# Patient Record
Sex: Male | Born: 2000 | Race: White | Hispanic: No | Marital: Single | Smoking: Current every day smoker
Health system: Southern US, Community
[De-identification: ages and names within clinical notes are randomized; demographics above are authoritative.]

## PROBLEM LIST (undated history)

## (undated) DIAGNOSIS — F319 Bipolar disorder, unspecified: Secondary | ICD-10-CM

## (undated) DIAGNOSIS — F39 Unspecified mood [affective] disorder: Secondary | ICD-10-CM

## (undated) DIAGNOSIS — F32A Depression, unspecified: Secondary | ICD-10-CM

## (undated) DIAGNOSIS — F419 Anxiety disorder, unspecified: Secondary | ICD-10-CM

## (undated) HISTORY — DX: Depression, unspecified: F32.A

## (undated) HISTORY — DX: Bipolar disorder, unspecified: F31.9

## (undated) HISTORY — DX: Anxiety disorder, unspecified: F41.9

---
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
## 2020-02-23 ENCOUNTER — Telehealth: Payer: Self-pay | Admitting: General Practice

## 2020-02-23 NOTE — Telephone Encounter (Signed)
Advise patient: Jacob Matthews to establish with me. He has a history of bipolar, needs to see psychiatry for refill on his medications.

## 2020-02-23 NOTE — Telephone Encounter (Signed)
Patient is requesting to establish care with you b/c his grandfather sees you (Jacob Matthews)   Please advise

## 2020-02-24 NOTE — Telephone Encounter (Signed)
Spoke to patient appt was scheduled. Patient was told to come by prior to his visit to sign ROI, also Dr. Drue Novel note in regards to psychiatry

## 2020-02-24 NOTE — Telephone Encounter (Signed)
Okay to schedule NP appt at his convenience. Please have him come by prior to his visit to sign an ROI to get his previous records.

## 2020-04-06 DIAGNOSIS — F32A Depression, unspecified: Secondary | ICD-10-CM | POA: Insufficient documentation

## 2020-04-06 DIAGNOSIS — F319 Bipolar disorder, unspecified: Secondary | ICD-10-CM | POA: Insufficient documentation

## 2020-04-06 DIAGNOSIS — F419 Anxiety disorder, unspecified: Secondary | ICD-10-CM | POA: Insufficient documentation

## 2020-04-07 ENCOUNTER — Other Ambulatory Visit: Payer: Self-pay

## 2020-04-07 ENCOUNTER — Ambulatory Visit (INDEPENDENT_AMBULATORY_CARE_PROVIDER_SITE_OTHER): Payer: Managed Care, Other (non HMO) | Admitting: Internal Medicine

## 2020-04-07 ENCOUNTER — Encounter: Payer: Self-pay | Admitting: Internal Medicine

## 2020-04-07 ENCOUNTER — Ambulatory Visit (HOSPITAL_BASED_OUTPATIENT_CLINIC_OR_DEPARTMENT_OTHER)
Admission: RE | Admit: 2020-04-07 | Discharge: 2020-04-07 | Disposition: A | Discharge status: Home / Self Care | Payer: Managed Care, Other (non HMO) | Source: Ambulatory Visit | Attending: Internal Medicine | Admitting: Internal Medicine

## 2020-04-07 VITALS — BP 126/73 | HR 84 | Temp 98.3°F | Ht 76.0 in | Wt 174.0 lb

## 2020-04-07 DIAGNOSIS — R079 Chest pain, unspecified: Secondary | ICD-10-CM

## 2020-04-07 NOTE — Progress Notes (Signed)
Subjective:    Patient ID: Jacob Matthews, male    DOB: [birth year], 19 y.o.   MRN: [id number]  DOS:  04/07/2020 Type of visit - description: New patient, referred by his grandfather who is my patient  The patient is concerned because he has been awakened by chest pain 4 times in the last 4 months. The patient is a sleeping, awakened by the pain, it is a stabbing feeling in the center of the chest, last about 30 minutes. No radiation, no associated nausea or vomiting. Mild palpitations and diaphoresis?. Episodes are random and he cannot point to triggers.  Review of Systems He is active at work and has no exertional symptoms No shortness of breath, lower extremity edema or palpitations. No heartburn, dysphagia or odynophagia. Occasional emotional stress but nothing out of the ordinary.   Past Medical History:  Diagnosis Date  . Anxiety   . Bipolar disorder (HCC)   . Depression     History reviewed. No pertinent surgical history.  Allergies as of 04/07/2020   No Known Allergies     Medication List       Accurate as of April 07, 2020 11:59 PM. If you have any questions, ask your nurse or doctor.        STOP taking these medications   divalproex 500 MG DR tablet Commonly known as: DEPAKOTE Stopped by: Willow Ora, MD   lamoTRIgine 100 MG tablet Commonly known as: LAMICTAL Stopped by: Willow Ora, MD   OLANZapine 10 MG tablet Commonly known as: ZYPREXA Stopped by: Willow Ora, MD     TAKE these medications   ibuprofen 200 MG tablet Commonly known as: ADVIL Take 600 mg by mouth every 6 (six) hours as needed (pain).          Objective:   Physical Exam BP 126/73 (BP Location: Right Arm, Patient Position: Sitting, Cuff Size: Large)   Pulse 84   Temp 98.3 F (36.8 C) (Oral)   Ht 6\' 4"  (1.93 m)   Wt 174 lb (78.9 kg)   SpO2 97%   BMI 21.18 kg/m     General:   Well developed, NAD, BMI noted.  HEENT:  Normocephalic . Face symmetric, atraumatic Neck: No  thyromegaly Lungs:  CTA B Normal respiratory effort, no intercostal retractions, no accessory muscle use. Heart: RRR,  no murmur.  Abdomen:  Not distended, soft, non-tender. No rebound or rigidity.   Skin: Not pale. Not jaundice Lower extremities: no pretibial edema bilaterally  Neurologic:  alert & oriented X3.  Speech normal, gait appropriate for age and unassisted Psych--  Cognition and judgment appear intact.  Cooperative with normal attention span and concentration.  Behavior appropriate. No anxious or depressed appearing.   Assessment    Assessment (new patient 04/2020 referred by 05/2020, his grandfather) H/o anxiety-depression   Plan: Chest pain: Random episodes, approximately once a month, last episode was 4 weeks ago, they are described in the HPI, not exertional. Etiology not completely clear. EKG: NSR, no evidence of pericarditis. Plan: Check a chest x-ray, CMP, CBC, sed rate.  If symptoms become more frequent or intense may need further evaluation >> ECHO ?, trial with PPIs?. Preventive care: Declined a flu shot, encouraged to pursue a Covid booster RTC 6 to 8 weeks CPX.    This visit occurred during the SARS-CoV-2 public health emergency.  Safety protocols were in place, including screening questions prior to the visit, additional usage of staff PPE, and extensive cleaning of exam room  while observing appropriate contact time as indicated for disinfecting solutions.

## 2020-04-07 NOTE — Patient Instructions (Signed)
If you have more frequent or intense pains, please call immediately  GO TO THE LAB : Get the blood work     GO TO THE FRONT DESK, PLEASE SCHEDULE YOUR APPOINTMENTS Come back for a physical exam in 6 to 8 weeks  Check the  blood pressure 2 or 3 times a month week monthly weekly daily BP GOAL is between 110/65 and  135/85. If it is consistently higher or lower, let me know   STOP BY THE FIRST FLOOR:  get the XR

## 2020-04-08 LAB — CBC WITH DIFFERENTIAL/PLATELET
Basophils Absolute: 0 10*3/uL (ref 0.0–0.1)
Basophils Relative: 0.4 % (ref 0.0–3.0)
Eosinophils Absolute: 0.1 10*3/uL (ref 0.0–0.7)
Eosinophils Relative: 1.5 % (ref 0.0–5.0)
HCT: 43.9 % (ref 39.0–52.0)
Hemoglobin: 15 g/dL (ref 13.0–17.0)
Lymphocytes Relative: 32.1 % (ref 12.0–46.0)
Lymphs Abs: 1.6 10*3/uL (ref 0.7–4.0)
MCHC: 34.2 g/dL (ref 30.0–36.0)
MCV: 88.1 fl (ref 78.0–100.0)
Monocytes Absolute: 0.4 10*3/uL (ref 0.1–1.0)
Monocytes Relative: 7.8 % (ref 3.0–12.0)
Neutro Abs: 2.9 10*3/uL (ref 1.4–7.7)
Neutrophils Relative %: 58.2 % (ref 43.0–77.0)
Platelets: 170 10*3/uL (ref 150.0–400.0)
RBC: 4.99 Mil/uL (ref 4.22–5.81)
RDW: 12.5 % (ref 11.5–14.6)
WBC: 4.9 10*3/uL (ref 4.5–10.5)

## 2020-04-08 LAB — COMPREHENSIVE METABOLIC PANEL
ALT: 14 U/L (ref 0–53)
AST: 16 U/L (ref 0–37)
Albumin: 4.7 g/dL (ref 3.5–5.2)
Alkaline Phosphatase: 61 U/L (ref 39–117)
BUN: 12 mg/dL (ref 6–23)
CO2: 32 mEq/L (ref 19–32)
Calcium: 9.5 mg/dL (ref 8.4–10.5)
Chloride: 103 mEq/L (ref 96–112)
Creatinine, Ser: 0.95 mg/dL (ref 0.40–1.50)
GFR: 115.45 mL/min (ref >60.00)
Glucose, Bld: 66 mg/dL — ABNORMAL LOW (ref 70–99)
Potassium: 4.2 mEq/L (ref 3.5–5.1)
Sodium: 141 mEq/L (ref 135–145)
Total Bilirubin: 0.9 mg/dL (ref 0.2–1.2)
Total Protein: 7.4 g/dL (ref 6.0–8.3)

## 2020-04-08 LAB — SEDIMENTATION RATE: Sed Rate: 3 mm/hr (ref 0–15)

## 2020-04-10 ENCOUNTER — Encounter: Payer: Self-pay | Admitting: Internal Medicine

## 2020-06-16 ENCOUNTER — Encounter: Payer: Managed Care, Other (non HMO) | Admitting: Internal Medicine

## 2020-06-16 ENCOUNTER — Encounter: Payer: Self-pay | Admitting: Internal Medicine

## 2020-07-20 ENCOUNTER — Encounter: Payer: Self-pay | Admitting: Internal Medicine

## 2020-07-20 ENCOUNTER — Other Ambulatory Visit: Payer: Self-pay

## 2020-07-20 ENCOUNTER — Ambulatory Visit (HOSPITAL_BASED_OUTPATIENT_CLINIC_OR_DEPARTMENT_OTHER)
Admission: RE | Admit: 2020-07-20 | Discharge: 2020-07-20 | Disposition: A | Discharge status: Home / Self Care | Payer: Managed Care, Other (non HMO) | Source: Ambulatory Visit | Attending: Internal Medicine | Admitting: Internal Medicine

## 2020-07-20 ENCOUNTER — Ambulatory Visit (INDEPENDENT_AMBULATORY_CARE_PROVIDER_SITE_OTHER): Payer: Managed Care, Other (non HMO) | Admitting: Internal Medicine

## 2020-07-20 VITALS — BP 131/79 | HR 76 | Temp 98.0°F | Ht 76.0 in | Wt 168.6 lb

## 2020-07-20 DIAGNOSIS — R079 Chest pain, unspecified: Secondary | ICD-10-CM | POA: Insufficient documentation

## 2020-07-20 DIAGNOSIS — J939 Pneumothorax, unspecified: Secondary | ICD-10-CM

## 2020-07-20 DIAGNOSIS — Z09 Encounter for follow-up examination after completed treatment for conditions other than malignant neoplasm: Secondary | ICD-10-CM | POA: Insufficient documentation

## 2020-07-20 LAB — SEDIMENTATION RATE: Sed Rate: 1 mm/hr (ref 0–15)

## 2020-07-20 NOTE — Progress Notes (Signed)
Subjective:    Patient ID: Jacob Matthews, male    DOB: [birth year], 19 y.o.   MRN: [id number]  DOS:  07/20/2020 Type of visit - description: acute  Symptoms a started yesterday, at 2 p.m. Report chest pain with a very deep breath that radiates to his back. The pain is located at the left sternal border, no associated with other symptoms. I ask if the pain is worse when he leans forward and he said yes.  She was seen with chest pain few months ago but he states "this is different".   Review of Systems Denies fever chills. No recent URI No shortness of breath, lower extremity edema, palpitation.  No rash No heartburn, dysphagia or odynophagia No cough or sputum production  Past Medical History:  Diagnosis Date  . Anxiety   . Bipolar disorder (HCC)   . Depression     No past surgical history on file.  Allergies as of 07/20/2020   No Known Allergies     Medication List       Accurate as of July 20, 2020  9:00 AM. If you have any questions, ask your nurse or doctor.        ibuprofen 200 MG tablet Commonly known as: ADVIL Take 600 mg by mouth every 6 (six) hours as needed (pain).          Objective:   Physical Exam BP 131/79 (BP Location: Right Arm, Patient Position: Sitting, Cuff Size: Large)   Pulse 76   Temp 98 F (36.7 C) (Oral)   Ht 6\' 4"  (1.93 m)   Wt 168 lb 9.6 oz (76.5 kg)   SpO2 100%   BMI 20.52 kg/m       General:   Well developed, NAD, BMI noted.  HEENT:  Normocephalic . Face symmetric, atraumatic Neck: No lymphadenopathies Lungs:  CTA B Normal respiratory effort, no intercostal retractions, no accessory muscle use. Heart: RRR,  no murmur.  Chest wall: Mild pectus excavatum, slightly TTP at the left costochondral area? Abdomen:  Not distended, soft, non-tender. No rebound or rigidity.   Skin: Not pale. Not jaundice MSK: He has long-arm,but no  clear-cut Marfan's appearance Neurologic:  alert & oriented X3.  Speech normal, gait  appropriate for age and unassisted Psych--  Cognition and judgment appear intact.  Cooperative with normal attention span and concentration.  Behavior appropriate. No anxious or depressed appearing.     Assessment     Assessment (new patient 04/2020 referred by 05/2020, his grandfather) H/o anxiety-depression   PLAN Chest pain: As described above, he was seen with chest pain 3 months ago, he reports a previous episode was different and on retrospect they were "panic attacks". Etiology not clear but I will redo the work-up.  DDx: MSK, pericarditis, pleuritis, spontaneous PTX, others. EKG today: NSR, no changes of pericarditis. Check a sed rate, chest x-ray and echocardiogram. Call if symptoms severe or persist Rec ibuprofen with GI precautions Addendum: Chest x-ray showed a 10% pneumothorax.  Likely spontaneous PTX, vital signs are stable.  Likely will need only observation but asked pulmonary to see, d/w Dr Myrene Buddy appreciate his help. Plan: Refer to pulmonary  Cancel echo for now Work note ER if increasing cough, chest pain, difficulty breathing. Patient aware and in agreement.   This visit occurred during the SARS-CoV-2 public health emergency.  Safety protocols were in place, including screening questions prior to the visit, additional usage of staff PPE, and extensive cleaning of exam room while  observing appropriate contact time as indicated for disinfecting solutions.

## 2020-07-20 NOTE — Patient Instructions (Addendum)
IBUPROFEN (Advil or Motrin) 200 mg 2 tablets every 12 hours as needed for pain.  Always take it with food because may cause gastritis and ulcers.  If you notice nausea, stomach pain, change in the color of stools --->  Stop the medicine and let us know  We will schedule echocardiogram in [facility]  GO TO THE LAB : Get the blood work     GO TO THE FRONT DESK, PLEASE SCHEDULE YOUR APPOINTMENTS Come back for a checkup in 3 months  STOP BY THE FIRST FLOOR:  get the XR

## 2020-07-20 NOTE — Assessment & Plan Note (Signed)
Chest pain: As described above, he was seen with chest pain 3 months ago, he reports a previous episode was different and on retrospect they were "panic attacks". Etiology not clear but I will redo the work-up.  DDx: MSK, pericarditis, pleuritis, spontaneous PTX, others. EKG today: NSR, no changes of pericarditis. Check a sed rate, chest x-ray and echocardiogram. Call if symptoms severe or persist Rec ibuprofen with GI precautions Addendum: Chest x-ray showed a 10% pneumothorax.  Likely spontaneous PTX, vital signs are stable.  Likely will need only observation but asked pulmonary to see, d/w Dr Sherene Sires appreciate his help. Plan: Refer to pulmonary  Cancel echo for now Work note ER if increasing cough, chest pain, difficulty breathing. Patient aware and in agreement.

## 2020-07-22 ENCOUNTER — Ambulatory Visit: Payer: Managed Care, Other (non HMO)

## 2020-07-22 ENCOUNTER — Encounter: Payer: Self-pay | Admitting: *Deleted

## 2020-07-22 ENCOUNTER — Encounter: Payer: Self-pay | Admitting: Internal Medicine

## 2020-07-22 ENCOUNTER — Telehealth: Payer: Self-pay

## 2020-07-22 ENCOUNTER — Other Ambulatory Visit: Payer: Self-pay

## 2020-07-22 ENCOUNTER — Ambulatory Visit (INDEPENDENT_AMBULATORY_CARE_PROVIDER_SITE_OTHER): Payer: Managed Care, Other (non HMO) | Admitting: Internal Medicine

## 2020-07-22 DIAGNOSIS — J9383 Other pneumothorax: Secondary | ICD-10-CM | POA: Diagnosis not present

## 2020-07-22 DIAGNOSIS — F17210 Nicotine dependence, cigarettes, uncomplicated: Secondary | ICD-10-CM

## 2020-07-22 NOTE — Progress Notes (Signed)
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 Pulmonary Consult    Recent pneumothorax. He has some left chest pain if rolls over on left side or takes a big, deep breath.   Dyspnea:  Not very active / no doe   Cough: none  Sleep: if rolls on L side pain  SABA use: no inhalers  Pain much better vs at onset s analgesics   No obvious day to day or daytime variability or assoc excess/ purulent sputum or mucus plugs or hemoptysis or   chest tightness, subjective wheeze or overt sinus or hb symptoms.   Sleeping  without nocturnal  or early am exacerbation  of respiratory  c/o's or need for noct saba. Also denies any obvious fluctuation of symptoms with weather or environmental changes or other aggravating or alleviating factors except as outlined above   No unusual exposure hx or h/o childhood pna/ asthma or knowledge of premature birth.  Current Allergies, Complete Past Medical History, Past Surgical History, Family History, and Social History were reviewed in [employer] record.  ROS  The following are not active complaints unless bolded Hoarseness, sore throat, dysphagia, dental problems, itching, sneezing,  nasal congestion or discharge of excess mucus or purulent secretions, ear ache,   fever, chills, sweats, unintended wt loss or wt gain, classically  exertional cp,  orthopnea pnd or arm/hand swelling  or leg swelling, presyncope, palpitations, abdominal pain, anorexia, nausea, vomiting, diarrhea  or change in bowel habits  or change in bladder habits, change in stools or change in urine, dysuria, hematuria,  rash, arthralgias, visual complaints, headache, numbness, weakness or ataxia or problems with walking or coordination,  change in mood or  memory.             Past Medical History:  Diagnosis Date  . Anxiety   . Bipolar disorder (HCC)   . Depression     Outpatient Medications Prior to Visit  Medication Sig Dispense Refill  . ibuprofen (ADVIL,MOTRIN) 200 MG tablet Take 600 mg by mouth every 6 (six) hours as needed (pain).      No facility-administered medications prior to visit.     Objective:     BP 104/62 (BP Location: Left Arm, Cuff Size: Normal)   Pulse 73   Temp 97.9 F (36.6 C) (Temporal)   Ht 6\' 4"  (1.93 m)   Wt 168 lb 12.8 oz (76.6 kg)   SpO2 97% Comment: on RA  BMI 20.55 kg/m   SpO2: 97 % (on RA)  Pleasant asthenic wm nad   HEENT : pt wearing mask not removed for exam due to covid -19 concerns.    NECK :  without JVD/Nodes/TM/ nl carotid upstrokes bilaterally   LUNGS: no acc muscle use,  Nl contour chest with sltly  decreased bs L apex  without cough on insp or exp maneuvers   CV:  RRR  no s3 or murmur or increase in P2, and  no edema   ABD:  soft and nontender with nl inspiratory excursion in the supine position. No bruits or organomegaly appreciated, bowel sounds nl  MS:  Nl gait/ ext warm without deformities, calf tenderness, cyanosis or clubbing No obvious joint restrictions   SKIN: warm and dry without lesions    NEURO:  alert, approp, nl sensorium with  no motor or cerebellar deficits apparent.   I personally reviewed images and agree with radiology impression as follows:  CXR:  07/20/20 10% pneumothorax on the left.      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     Cigarette smoker Counseled re importance of smoking cessation but did not meet time criteria for separate billing         Each maintenance medication was reviewed in detail including emphasizing most importantly the difference between maintenance and prns and under what circumstances the prns are to be triggered using an action plan format where appropriate.  Total time for H and P, chart review, counseling, reviewing   and generating customized AVS unique to this new pt office visit / same day charting = 30 min           Sandrea Hughs, MD 07/22/2020

## 2020-07-22 NOTE — Patient Instructions (Signed)
You most likely have a small bleb on the surface of your left lung that raptured and will heal on its own.  Come in for cxr and go the lab around 10 am and I'll review the results to clear you for your work.  The key is to stop smoking completely before smoking completely stops you!

## 2020-07-22 NOTE — Assessment & Plan Note (Addendum)
Onset of symptoms was 07/19/20  -  cxr 07/20/20 10% L PTX - alpha one AT phenotype 07/22/2020 >>>   Reviewed pathophysiology/ natural hx of spont ptx with pt emphasizing smoking likely adding to risk and should be stopped completely with main rx for now staying off feet, no travel, f/u first of the week of 3/21 with cxr and labs and f/u prn   - if worse needs to go to ER in meantime.

## 2020-07-22 NOTE — Telephone Encounter (Signed)
Noted, thank you

## 2020-07-22 NOTE — Telephone Encounter (Signed)
-----   Message from Nyoka Cowden, MD sent at 07/22/2020  8:28 AM EDT ----- Regarding: RE: Pneumothorax Needs appt this week ----- Message ----- From: Conrad [place], CMA Sent: 07/22/2020   8:01 AM EDT To: Christen Butter, CMA, Nyoka Cowden, MD Subject: RE: Pneumothorax                               Received a mychart message last night- Pt hasn't been called about an appointment yet. Can someone follow-up on this please?   Thank you,  KDC ----- Message ----- From: Nyoka Cowden, MD Sent: 07/20/2020   4:35 PM EDT To: Wanda Plump, MD, Christen Butter, CMA Subject: RE: Pneumothorax                               Tomasa Rand let's get him in this week to see me and if not slots ok to add to Thursday pm  ----- Message ----- From: Wanda Plump, MD Sent: 07/20/2020   2:58 PM EDT To: Wanda Plump, MD, Nyoka Cowden, MD Subject: Pneumothorax                                   Casimiro Needle,   this young fellow presented with chest pain, no other symptoms.   A chest x-ray show small pneumothorax, I think is spontaneous pneumothorax.  (No history of trauma or asthma or frequent cough).  Could you see him in follow-up?  I believe the appropriate treatment for now is observation. Thank you JP

## 2020-07-22 NOTE — Telephone Encounter (Signed)
Attempted to contact patient, voicemail is full. Attempted to contact patient mother, line just rings and rings and gives busy tone.   Added patient to MW schedule per note. Will send message via mychart as well since unable to get patient on phone.

## 2020-07-22 NOTE — Telephone Encounter (Signed)
Appt scheduled today at 4pm.  ?

## 2020-07-22 NOTE — Assessment & Plan Note (Signed)
Counseled re importance of smoking cessation but did not meet time criteria for separate billing            Each maintenance medication was reviewed in detail including emphasizing most importantly the difference between maintenance and prns and under what circumstances the prns are to be triggered using an action plan format where appropriate.  Total time for H and P, chart review, counseling, reviewing   and generating customized AVS unique to this new pt office visit / same day charting = 30 mn

## 2020-07-24 ENCOUNTER — Encounter: Payer: Self-pay | Admitting: Internal Medicine

## 2020-07-26 ENCOUNTER — Ambulatory Visit (INDEPENDENT_AMBULATORY_CARE_PROVIDER_SITE_OTHER): Payer: Managed Care, Other (non HMO)

## 2020-07-26 ENCOUNTER — Other Ambulatory Visit: Payer: Managed Care, Other (non HMO)

## 2020-07-26 DIAGNOSIS — J9383 Other pneumothorax: Secondary | ICD-10-CM

## 2020-07-28 NOTE — Progress Notes (Signed)
Spoke with pt and notified of results per Dr. Sherene Sires. Pt verbalized understanding. He states needing to know if there are any restrictions he should follow at work. Please advise thanks!

## 2020-07-28 NOTE — Progress Notes (Signed)
Pt aware of response per MW.

## 2020-08-06 LAB — ALPHA-1 ANTITRYPSIN PHENOTYPE: A-1 Antitrypsin, Ser: 156 mg/dL (ref 83–199)

## 2020-08-10 ENCOUNTER — Encounter: Payer: Self-pay | Admitting: *Deleted

## 2020-10-08 ENCOUNTER — Other Ambulatory Visit (HOSPITAL_BASED_OUTPATIENT_CLINIC_OR_DEPARTMENT_OTHER): Payer: Self-pay

## 2020-10-08 ENCOUNTER — Encounter: Payer: Self-pay | Admitting: Internal Medicine

## 2020-10-08 ENCOUNTER — Other Ambulatory Visit: Payer: Self-pay

## 2020-10-08 ENCOUNTER — Telehealth (INDEPENDENT_AMBULATORY_CARE_PROVIDER_SITE_OTHER): Payer: Managed Care, Other (non HMO) | Admitting: Internal Medicine

## 2020-10-08 VITALS — Ht 76.0 in | Wt 178.0 lb

## 2020-10-08 DIAGNOSIS — U071 COVID-19: Secondary | ICD-10-CM | POA: Diagnosis not present

## 2020-10-08 MED ORDER — NIRMATRELVIR/RITONAVIR (PAXLOVID)TABLET
3.0000 | ORAL_TABLET | Freq: Two times a day (BID) | ORAL | 0 refills | Status: AC
  Filled 2020-10-08: qty 30, 5d supply, fill #0

## 2020-10-08 NOTE — Progress Notes (Signed)
Subjective:    Patient ID: Jacob Matthews, male    DOB: [birth year], 20 y.o.   MRN: [id number]  DOS:  10/08/2020 Type of visit - description: Virtual Visit via Video Note  I connected with the above patient  by a video enabled telemedicine application and verified that I am speaking with the correct person using two identifiers.   THIS ENCOUNTER IS A VIRTUAL VISIT DUE TO COVID-19 - PATIENT WAS NOT SEEN IN THE OFFICE. PATIENT HAS CONSENTED TO VIRTUAL VISIT / TELEMEDICINE VISIT   Location of patient: home  Location of provider: office  Persons participating in the virtual visit: patient, provider   I discussed the limitations of evaluation and management by telemedicine and the availability of in person appointments. The patient expressed understanding and agreed to proceed.  Acute Symptoms a started 2 days ago: Sudden onset of weakness, nausea, fatigue. He took 2 COVID test and both came back positive.  The first 2 days were really rough: Subjective fever and chills Mild headache Severe cough Denies chest pain no difficulty breathing Sinus congestion Mild abdominal pain. Some myalgias.  Overall today for the first time he feels somewhat better but he still is very tired and has some mild cough.  Review of Systems See above   Past Medical History:  Diagnosis Date  . Anxiety   . Bipolar disorder (HCC)   . Depression     No past surgical history on file.  Allergies as of 10/08/2020   No Known Allergies     Medication List       Accurate as of October 08, 2020 11:20 AM. If you have any questions, ask your nurse or doctor.        ibuprofen 200 MG tablet Commonly known as: ADVIL Take 600 mg by mouth every 6 (six) hours as needed (pain).          Objective:   Physical Exam Ht 6\' 4"  (1.93 m)   Wt 178 lb (80.7 kg)   BMI 21.67 kg/m  This is a virtual video visit, he looks tired but not toxic, no respiratory distress, alert oriented x3, smiling at times, BP was  check and he tells me it was okay.  No O2 sats available    Assessment     Assessment (new patient 04/2020 referred by 05/2020, his grandfather) H/o anxiety-depression   PLAN COVID-19: The patient is 20 years old, had 2 COVID vaccination but not a booster.  Last known kidney function  December 2021 and normal.  No history of diabetes or hypertension. Advised that we can decrease his risk of getting worse by prescribing Paxlovid and he agreed. Plan: Rest, good hydration, Tylenol, Robitussin-DM. Watch for paxlovid side effects. Call if not gradually better, call if symptoms resurface or if they are severe. He verbalized understanding. Prescription sent to our pharmacy, patient's mother will pick it up, encouraged to ask questions about the medication. He will be contagious for 10 days, work note sent. Rec a COVID booster in 3 to 44-month  Time spent 30 minutes, going over his symptoms, treatment options, treatment side effects, reviewing the chart to be sure he is kidney function was okay. Appropriate questions answered to the best of my ability   I discussed the assessment and treatment plan with the patient. The patient was provided an opportunity to ask questions and all were answered. The patient agreed with the plan and demonstrated an understanding of the instructions.   The patient was advised  to call back or seek an in-person evaluation if the symptoms worsen or if the condition fails to improve as anticipated.

## 2020-10-09 NOTE — Assessment & Plan Note (Signed)
COVID-19: The patient is 20 years old, had 2 COVID vaccination but not a booster.  Last known kidney function  December 2021 and normal.  No history of diabetes or hypertension. Advised that we can decrease his risk of getting worse by prescribing Paxlovid and he agreed. Plan: Rest, good hydration, Tylenol, Robitussin-DM. Watch for paxlovid side effects. Call if not gradually better, call if symptoms resurface or if they are severe. He verbalized understanding. Prescription sent to our pharmacy, patient's mother will pick it up, encouraged to ask questions about the medication. He will be contagious for 10 days, work note sent. Rec a COVID booster in 3 to 39-month

## 2020-10-20 ENCOUNTER — Encounter: Payer: Self-pay | Admitting: Internal Medicine

## 2020-10-20 ENCOUNTER — Ambulatory Visit: Payer: Managed Care, Other (non HMO) | Admitting: Internal Medicine

## 2021-06-29 ENCOUNTER — Encounter: Payer: Self-pay | Admitting: Internal Medicine

## 2022-07-26 IMAGING — DX DG CHEST 2V
2 series · 2 of 2 positions shown · non-contrast
Comparison: Chest x-ray 07/21/2015.

CLINICAL DATA: 19-year-old male with history of left-sided
spontaneous pneumothorax.

EXAM:
CHEST - 2 VIEW

[chest pa]
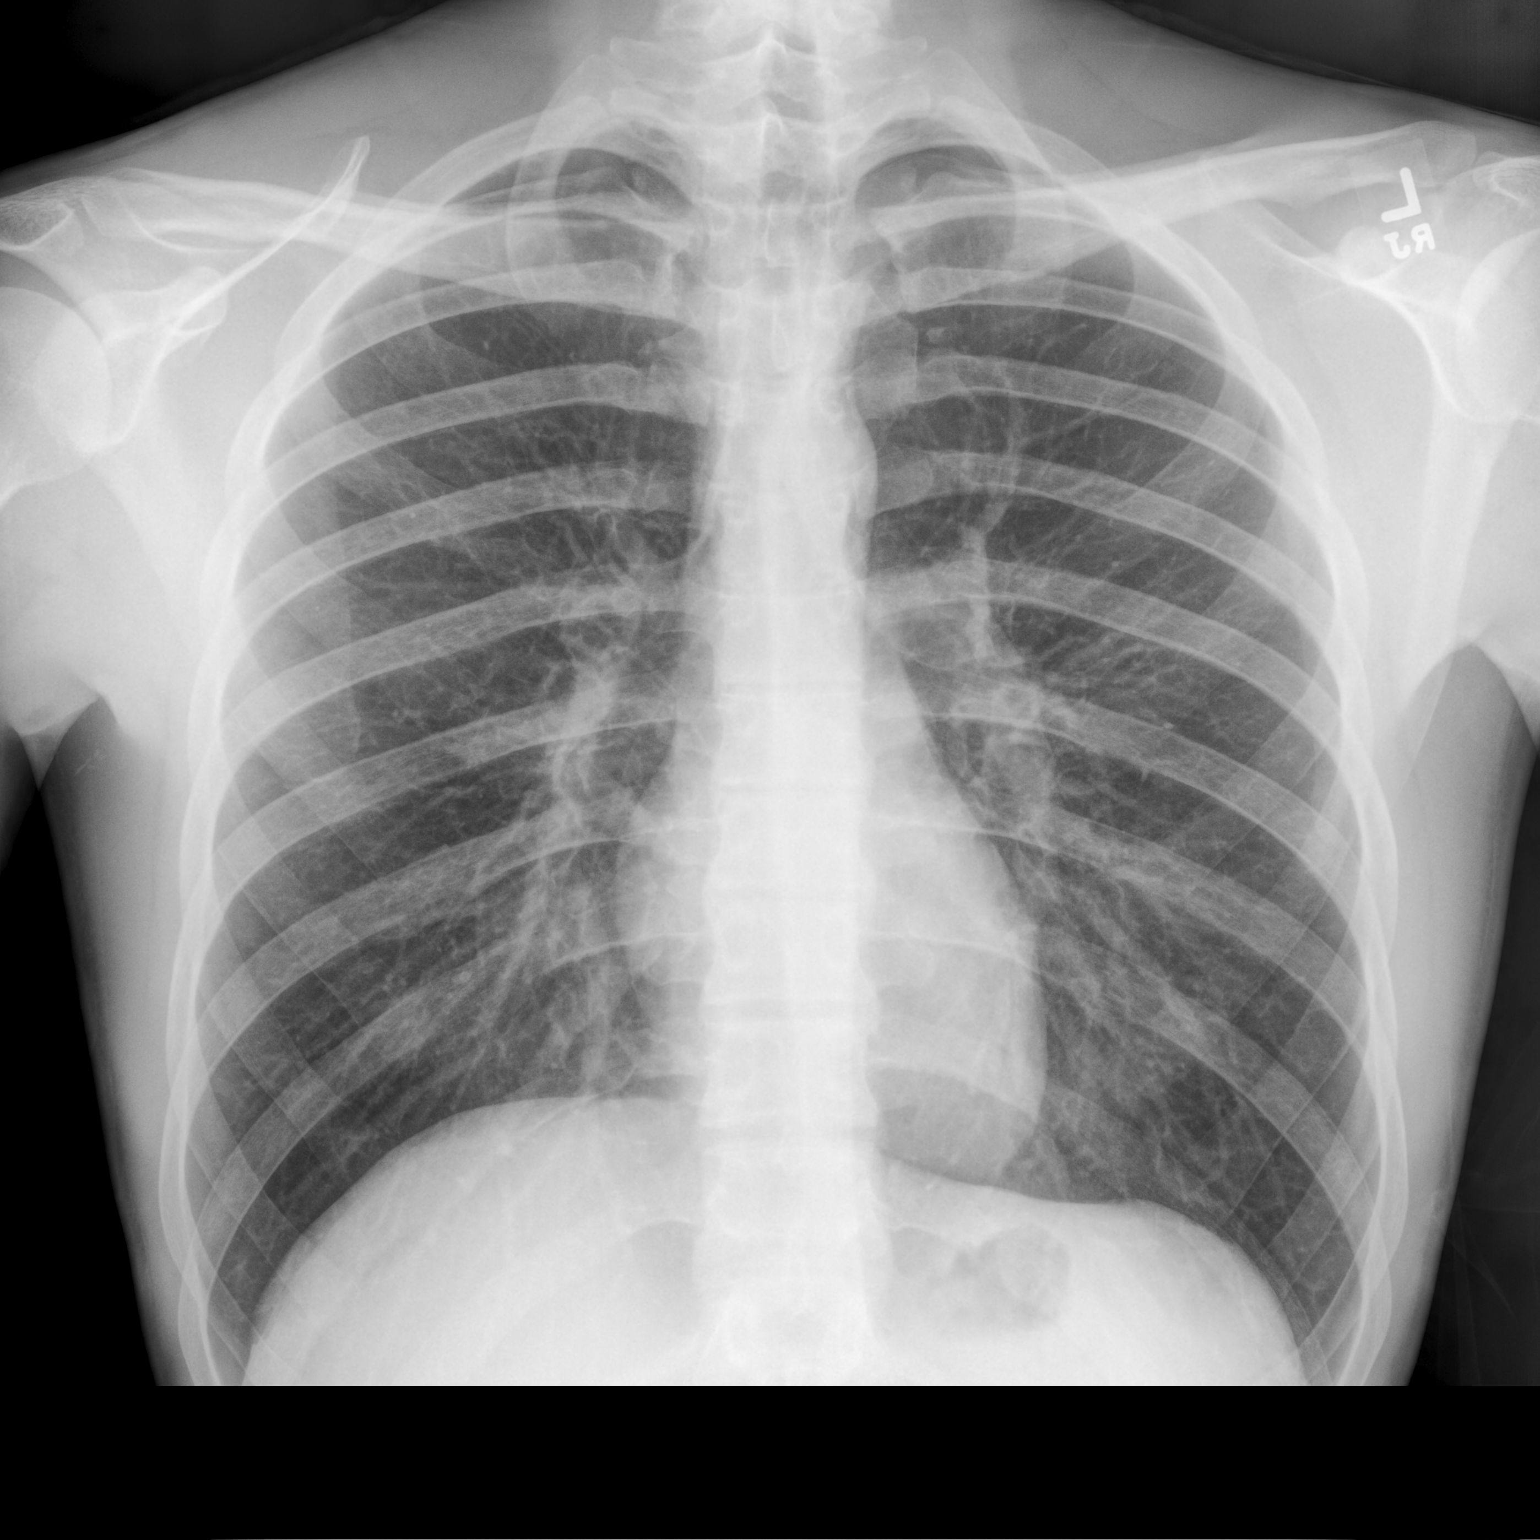

[chest lat]
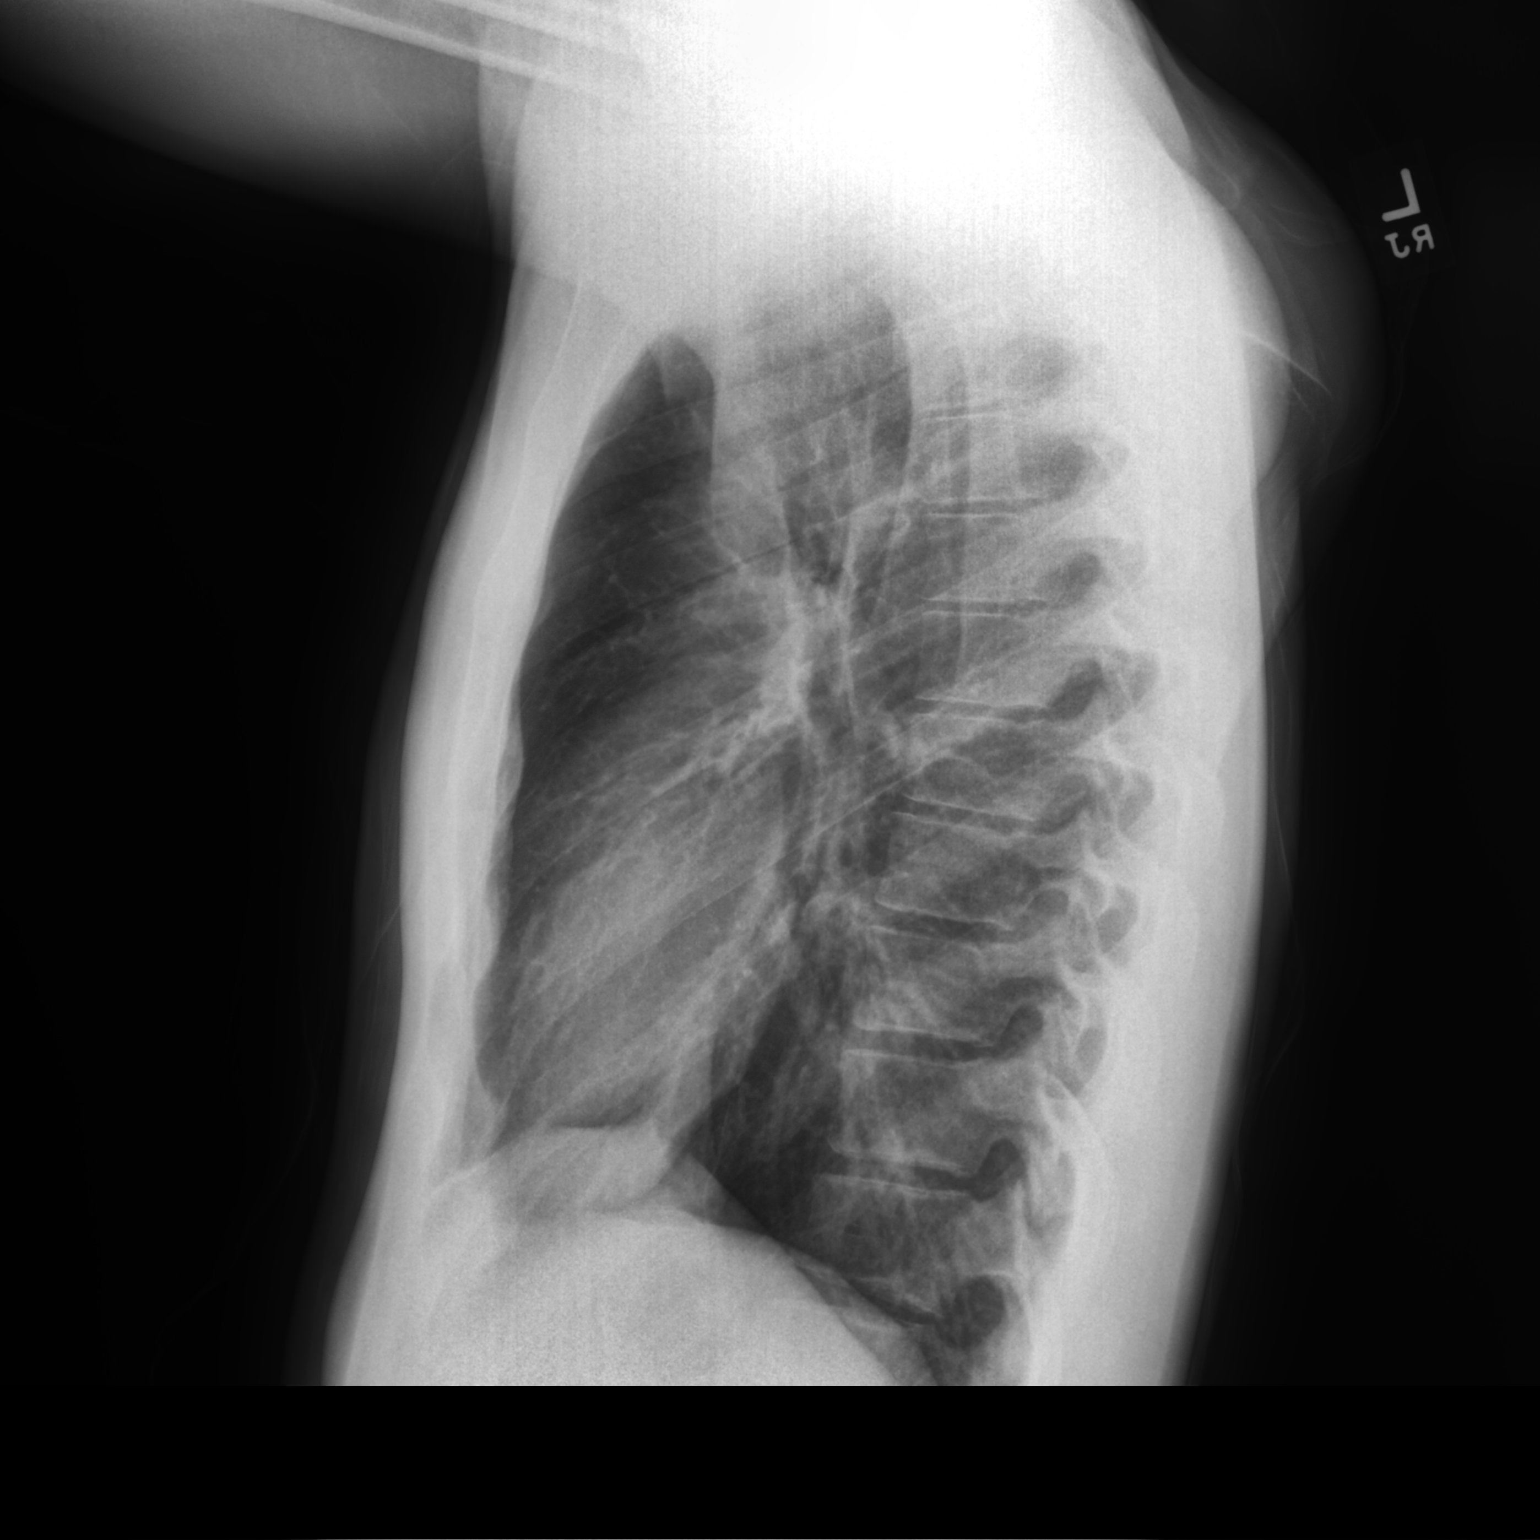

[2 of 2 positions shown; findings below may reference images not displayed]

FINDINGS: Previously noted left-sided pneumothorax is no longer confidently
identified. Lung volumes are normal. No consolidative airspace
disease. No pleural effusions. No pulmonary nodule or mass noted.
Pulmonary vasculature and the cardiomediastinal silhouette are
within normal limits.
IMPRESSION: 1. Resolution of previously noted left-sided pneumothorax.

## 2022-08-18 ENCOUNTER — Encounter: Payer: Self-pay | Admitting: Internal Medicine
